# Patient Record
Sex: Male | Born: 2000 | Race: White | Hispanic: Yes | Marital: Single | Smoking: Never smoker
Health system: Southern US, Community
[De-identification: ages and names within clinical notes are randomized; demographics above are authoritative.]

## PROBLEM LIST (undated history)

## (undated) DIAGNOSIS — J45909 Unspecified asthma, uncomplicated: Secondary | ICD-10-CM

## (undated) HISTORY — PX: TONSILLECTOMY: SUR1361

---
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
## 2021-04-27 ENCOUNTER — Emergency Department (HOSPITAL_COMMUNITY): Payer: BC Managed Care – PPO

## 2021-04-27 ENCOUNTER — Encounter (HOSPITAL_COMMUNITY): Payer: Self-pay | Admitting: Emergency Medicine

## 2021-04-27 ENCOUNTER — Emergency Department (HOSPITAL_COMMUNITY)
Admission: EM | Admit: 2021-04-27 | Discharge: 2021-04-27 | Disposition: A | Discharge status: Home / Self Care | Payer: BC Managed Care – PPO | Attending: Emergency Medicine | Admitting: Emergency Medicine

## 2021-04-27 DIAGNOSIS — S161XXA Strain of muscle, fascia and tendon at neck level, initial encounter: Secondary | ICD-10-CM

## 2021-04-27 DIAGNOSIS — J45909 Unspecified asthma, uncomplicated: Secondary | ICD-10-CM | POA: Diagnosis not present

## 2021-04-27 DIAGNOSIS — S8001XA Contusion of right knee, initial encounter: Secondary | ICD-10-CM

## 2021-04-27 DIAGNOSIS — R109 Unspecified abdominal pain: Secondary | ICD-10-CM | POA: Insufficient documentation

## 2021-04-27 DIAGNOSIS — S301XXA Contusion of abdominal wall, initial encounter: Secondary | ICD-10-CM

## 2021-04-27 LAB — I-STAT CHEM 8, ED
BUN: 14 mg/dL (ref 6–20)
Calcium, Ion: 1.13 mmol/L — ABNORMAL LOW (ref 1.15–1.40)
Chloride: 104 mmol/L (ref 98–111)
Creatinine, Ser: 0.7 mg/dL (ref 0.61–1.24)
Glucose, Bld: 89 mg/dL (ref 70–99)
HCT: 47 % (ref 39.0–52.0)
Hemoglobin: 16 g/dL (ref 13.0–17.0)
Potassium: 3.9 mmol/L (ref 3.5–5.1)
Sodium: 140 mmol/L (ref 135–145)
TCO2: 27 mmol/L (ref 22–32)

## 2021-04-27 MED ORDER — IOHEXOL 350 MG/ML SOLN
75.0000 mL | Freq: Once | INTRAVENOUS | Status: AC | PRN
  Administered 2021-04-27: 75 mL via INTRAVENOUS

## 2021-04-27 MED ORDER — METHOCARBAMOL 500 MG PO TABS
750.0000 mg | ORAL_TABLET | Freq: Once | ORAL | Status: AC
  Administered 2021-04-27: 750 mg via ORAL
  Filled 2021-04-27: qty 2

## 2021-04-27 MED ORDER — METHOCARBAMOL 750 MG PO TABS: 750.0000 mg | ORAL_TABLET | Freq: Three times a day (TID) | ORAL | 0 refills | Status: AC | PRN

## 2021-04-27 MED ORDER — IBUPROFEN 400 MG PO TABS
400.0000 mg | ORAL_TABLET | Freq: Once | ORAL | Status: AC
  Administered 2021-04-27: 400 mg via ORAL
  Filled 2021-04-27: qty 1

## 2021-04-27 MED ORDER — ACETAMINOPHEN 500 MG PO TABS
1000.0000 mg | ORAL_TABLET | Freq: Once | ORAL | Status: AC
  Administered 2021-04-27: 1000 mg via ORAL
  Filled 2021-04-27: qty 2

## 2021-04-27 NOTE — Discharge Instructions (Signed)
It was our pleasure to provide your ER care today - we hope that you feel better.  Rest. Take acetaminophen or ibuprofen as need for pain. You may also take robaxin as need for muscle pain/spasm - no driving for the next 6 hours, or when taking robaxin.   Follow up with primary care doctor in 1-2 weeks if symptoms fail to improve/resolve.  Return to ER if worse, new/severe pain, trouble breathing, or other concern.
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
## 2021-04-27 NOTE — ED Notes (Signed)
Pt had removed c-collar, stating it was uncomfortable before. Pt advised to put c-collar back on, c-collar placed back on patient.

## 2021-04-27 NOTE — ED Notes (Signed)
Pt to CT

## 2021-04-27 NOTE — ED Notes (Signed)
Patient verbalizes understanding of discharge instructions. Opportunity for questioning and answers were provided. Armband removed by staff, pt discharged from ED ambulatory.   
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
